# Patient Record
Sex: Male | Born: 1975 | Race: Asian | Hispanic: No | Marital: Single
Health system: Southern US, Community
[De-identification: ages and names within clinical notes are randomized; demographics above are authoritative.]

---

## 2013-01-13 ENCOUNTER — Ambulatory Visit
Admission: RE | Admit: 2013-01-13 | Discharge: 2013-01-13 | Disposition: A | Discharge status: Home / Self Care | Payer: No Typology Code available for payment source | Source: Ambulatory Visit | Attending: Infectious Diseases | Admitting: Infectious Diseases

## 2013-01-13 ENCOUNTER — Other Ambulatory Visit: Payer: Self-pay | Admitting: Infectious Diseases

## 2013-01-13 DIAGNOSIS — R7611 Nonspecific reaction to tuberculin skin test without active tuberculosis: Secondary | ICD-10-CM

## 2013-09-14 ENCOUNTER — Ambulatory Visit: Payer: Self-pay

## 2013-10-29 ENCOUNTER — Ambulatory Visit: Payer: Self-pay | Attending: Internal Medicine

## 2013-10-29 VITALS — Temp 97.7°F

## 2013-10-29 DIAGNOSIS — Z23 Encounter for immunization: Secondary | ICD-10-CM

## 2013-10-29 NOTE — Progress Notes (Signed)
Patient here for flu vaccine only.

## 2014-01-02 IMAGING — CR DG CHEST 1V
1 series · 1 of 1 positions shown · non-contrast
Comparison: None.

CLINICAL DATA: Positive PPD.  No current complain

CHEST - 1 VIEW

[view not recorded]
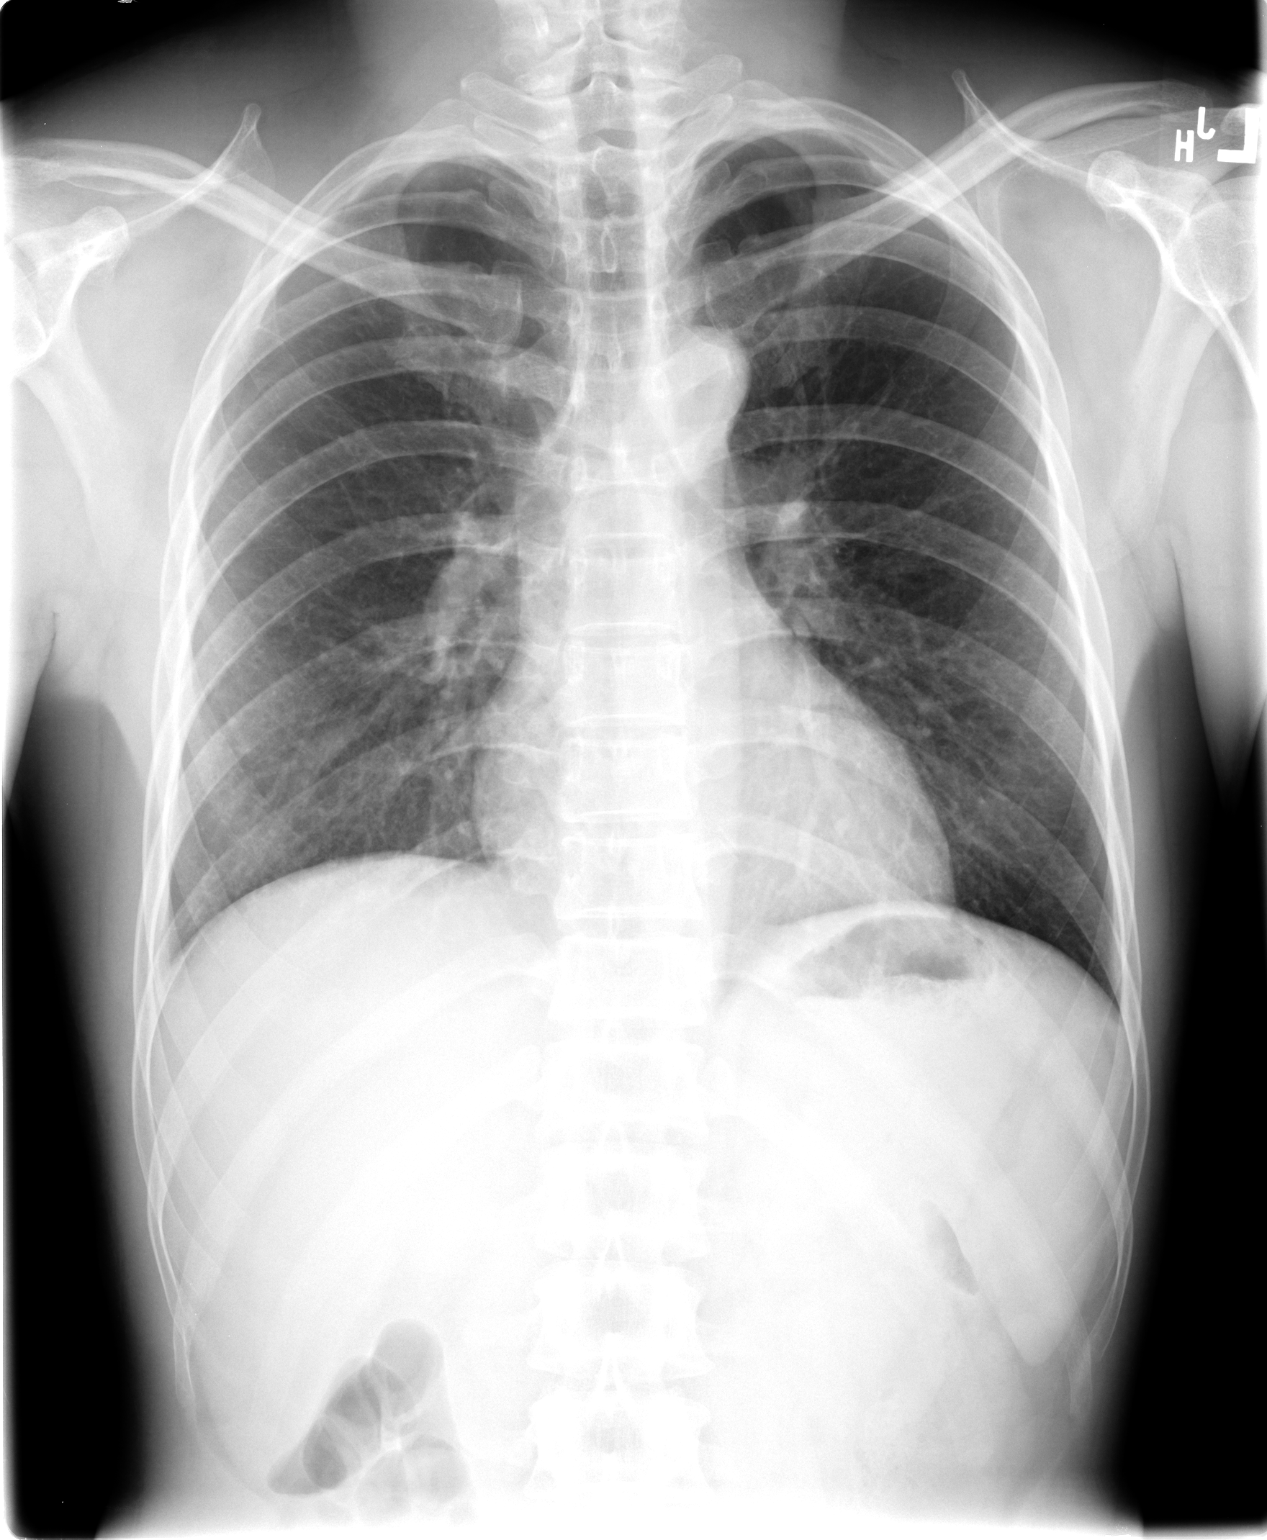

[1 of 1 positions shown; findings below may reference images not displayed]

FINDINGS: Heart and mediastinal contours are within normal limits.
Lung fields are clear with no signs of focal infiltrate or
congestive failure.  Mild bilateral apical pleural thickening is
seen.  No significant peribronchial cuffing or pleural fluid is
noted.

Bony structures appear intact.
IMPRESSION: No worrisome focal or acute cardiopulmonary abnormality seen.  No
radiographic stigmata of tuberculosis identified

## 2020-04-02 ENCOUNTER — Ambulatory Visit: Payer: Self-pay | Attending: Internal Medicine

## 2020-04-02 DIAGNOSIS — Z23 Encounter for immunization: Secondary | ICD-10-CM

## 2020-04-02 NOTE — Progress Notes (Signed)
   Cov[facility] Vaccination [facility]  Name:  Wilburt Messina    MRN: [id number] DOB: 07/28/1976  04/02/2020  Mr. Tuller was observed post Covid-19 immunization for 15 minutes without incident. He was provided with Vaccine Information Sheet and instruction to access the V-Safe system.   Mr. Saiki was instructed to call 911 with any severe reactions post vaccine: Marland Kitchen Difficulty breathing  . Swelling of face and throat  . A fast heartbeat  . A bad rash all over body  . Dizziness and weakness   Immunizations Administered    Name Date Dose VIS Date Route   Pfizer COVID-19 Vaccine 04/02/2020 12:00 PM 0.3 mL 12/02/2019 Intramuscular   Manufacturer: [employer], [employer]   Lot: [id number]   NDC: [address]-8

## 2020-04-23 ENCOUNTER — Ambulatory Visit: Payer: Self-pay | Attending: Internal Medicine

## 2020-04-23 DIAGNOSIS — Z23 Encounter for immunization: Secondary | ICD-10-CM

## 2020-04-23 NOTE — Progress Notes (Signed)
   Covid-19 Vaccination Clinic  Name:  Vishaal Strollo    MRN: [id number] DOB: 01-21-1976  04/23/2020  Mr. Haberle was observed post Covid-19 immunization for 15 minutes without incident. He was provided with Vaccine Information Sheet and instruction to access the V-Safe system.   Mr. Quackenbush was instructed to call 911 with any severe reactions post vaccine: Marland Kitchen Difficulty breathing  . Swelling of face and throat  . A fast heartbeat  . A bad rash all over body  . Dizziness and weakness   Immunizations Administered    Name Date Dose VIS Date Route   Pfizer COVID-19 Vaccine 04/23/2020  1:14 PM 0.3 mL 02/15/2019 Intramuscular   Manufacturer: [employer], [employer]   Lot: [id number]   NDC: [address]-0
